# Patient Record
Sex: Male | Born: 1990
Health system: Southern US, Community
[De-identification: ages and names within clinical notes are randomized; demographics above are authoritative.]

---
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
## 2015-06-29 ENCOUNTER — Ambulatory Visit (HOSPITAL_COMMUNITY)
Admission: EM | Admit: 2015-06-29 | Discharge: 2015-06-29 | Disposition: A | Discharge status: Home / Self Care | Payer: Self-pay | Attending: Emergency Medicine | Admitting: Emergency Medicine

## 2015-06-29 ENCOUNTER — Encounter (HOSPITAL_COMMUNITY): Payer: Self-pay

## 2015-06-29 DIAGNOSIS — K08499 Partial loss of teeth due to other specified cause, unspecified class: Secondary | ICD-10-CM

## 2015-06-29 DIAGNOSIS — K08409 Partial loss of teeth, unspecified cause, unspecified class: Secondary | ICD-10-CM

## 2015-06-29 DIAGNOSIS — K122 Cellulitis and abscess of mouth: Secondary | ICD-10-CM

## 2015-06-29 MED ORDER — AMOXICILLIN-POT CLAVULANATE 875-125 MG PO TABS: 1.0000 | ORAL_TABLET | Freq: Two times a day (BID) | ORAL | Status: AC

## 2015-06-29 MED ORDER — CHLORHEXIDINE GLUCONATE 0.12 % MT SOLN: 15.0000 mL | Freq: Two times a day (BID) | OROMUCOSAL | Status: AC

## 2015-06-29 MED ORDER — TRAMADOL HCL 50 MG PO TABS: 50.0000 mg | ORAL_TABLET | Freq: Four times a day (QID) | ORAL | Status: AC | PRN

## 2015-06-29 NOTE — ED Provider Notes (Signed)
CSN: [id number]     Arrival date & time 06/29/15  1725 History   First MD Initiated Contact with Patient 06/29/15 1840     Chief Complaint  Patient presents with  . Oral Swelling   (Consider location/radiation/quality/duration/timing/severity/associated sxs/prior Treatment) HPI Comments: Patient is a 25 yo black male who presents 2 days following a wisdom tooth extraction with pain and swelling. He reports feeling like "food was lodged" and then started swelling. He has no fever or chills. In pain.   The history is provided by the patient.    History reviewed. No pertinent past medical history. History reviewed. No pertinent past surgical history. No family history on file. Social History  Substance Use Topics  . Smoking status: Never Smoker   . Smokeless tobacco: Never Used  . Alcohol Use: No    Review of Systems  Constitutional: Negative for fever.  HENT: Negative for drooling.   Neurological: Negative for dizziness.    Allergies  Motrin  Home Medications   Prior to Admission medications   Medication Sig Start Date End Date Taking? Authorizing Provider  amoxicillin-clavulanate (AUGMENTIN) 875-125 MG tablet Take 1 tablet by mouth 2 (two) times daily. 06/29/15   Riki SheerMichelle G Dresden Ament, PA-C  chlorhexidine (PERIDEX) 0.12 % solution Use as directed 15 mLs in the mouth or throat 2 (two) times daily. 06/29/15   Riki SheerMichelle G Somaya Grassi, PA-C  traMADol (ULTRAM) 50 MG tablet Take 1 tablet (50 mg total) by mouth every 6 (six) hours as needed. 06/29/15   Riki SheerMichelle G Jevaughn Degollado, PA-C   Meds Ordered and Administered this Visit  Medications - No data to display  BP 148/78 mmHg  Pulse 80  Temp(Src) 98.1 F (36.7 C) (Oral)  Resp 18  SpO2 100% No data found.   Physical Exam  Constitutional: He is oriented to person, place, and time. He appears well-developed and well-nourished. No distress.  HENT:  Mouth/Throat: Oropharynx is clear and moist. No oropharyngeal exudate.  Swelling to right side of  face, no cervical or tonsillar adenopathy, right lower wound is without debri, swelling is noted and tenderness on exam, no other abnormalities noted  Neck: Normal range of motion.  Lymphadenopathy:    He has no cervical adenopathy.  Neurological: He is alert and oriented to person, place, and time.  Skin: Skin is warm and dry. He is not diaphoretic.  Psychiatric: His behavior is normal.  Nursing note and vitals reviewed.   ED Course  Procedures (including critical care time)  Labs Review Labs Reviewed - No data to display  Imaging Review No results found.   Visual Acuity Review  Right Eye Distance:   Left Eye Distance:   Bilateral Distance:    Right Eye Near:   Left Eye Near:    Bilateral Near:         MDM   1. Oral infection   2. Wisdom teeth removed, unspecified edentulism    Expect some swelling 2 days post op. No abnormalities noted from my standpoint. Will cover with antibiotic and use Peridex as well. Highly recommend f/u with Oral surgeon on Monday. Should he develop worsening pain or swelling, or fevers that become emergent then certainly go to the ED for further management.      Riki SheerMichelle G Helma Argyle, PA-C 06/29/15 1921

## 2015-06-29 NOTE — ED Notes (Signed)
Patient states his right bottom wisdom tooth was extracted on Thursday 06/27/2015 and he ate some food last night and thinks he may have gotten food in the incision and may have developed and infection No acute distress

## 2015-06-29 NOTE — Discharge Instructions (Signed)
I am giving you an antibiotic for this, though there does not look like an infection at this point. The rinse will also help your pain and discomfort. Use the pain medication as needed.F/U with your surgeon on Monday!

## 2021-03-28 ENCOUNTER — Emergency Department (HOSPITAL_COMMUNITY): Payer: Self-pay

## 2021-03-28 ENCOUNTER — Emergency Department (HOSPITAL_COMMUNITY)
Admission: EM | Admit: 2021-03-28 | Discharge: 2021-03-28 | Disposition: A | Discharge status: Home / Self Care | Payer: Self-pay | Attending: Emergency Medicine | Admitting: Emergency Medicine

## 2021-03-28 ENCOUNTER — Encounter (HOSPITAL_COMMUNITY): Payer: Self-pay | Admitting: Emergency Medicine

## 2021-03-28 DIAGNOSIS — R55 Syncope and collapse: Secondary | ICD-10-CM | POA: Insufficient documentation

## 2021-03-28 LAB — BASIC METABOLIC PANEL
Anion gap: 7 (ref 5–15)
BUN: 18 mg/dL (ref 6–20)
CO2: 24 mmol/L (ref 22–32)
Calcium: 9.1 mg/dL (ref 8.9–10.3)
Chloride: 107 mmol/L (ref 98–111)
Creatinine, Ser: 1.2 mg/dL (ref 0.61–1.24)
GFR, Estimated: >60 mL/min (ref >60)
Glucose, Bld: 87 mg/dL (ref 70–99)
Potassium: 4.7 mmol/L (ref 3.5–5.1)
Sodium: 138 mmol/L (ref 135–145)

## 2021-03-28 LAB — URINALYSIS, ROUTINE W REFLEX MICROSCOPIC
Bilirubin Urine: NEGATIVE
Glucose, UA: NEGATIVE mg/dL
Hgb urine dipstick: NEGATIVE
Ketones, ur: NEGATIVE mg/dL
Leukocytes,Ua: NEGATIVE
Nitrite: NEGATIVE
Protein, ur: NEGATIVE mg/dL
Specific Gravity, Urine: 1.02 (ref 1.005–1.030)
pH: 6 (ref 5.0–8.0)

## 2021-03-28 LAB — CBC
HCT: 48.7 % (ref 39.0–52.0)
Hemoglobin: 15.8 g/dL (ref 13.0–17.0)
MCH: 29.9 pg (ref 26.0–34.0)
MCHC: 32.4 g/dL (ref 30.0–36.0)
MCV: 92.2 fL (ref 80.0–100.0)
Platelets: 189 10*3/uL (ref 150–400)
RBC: 5.28 MIL/uL (ref 4.22–5.81)
RDW: 14.5 % (ref 11.5–15.5)
WBC: 6.8 10*3/uL (ref 4.0–10.5)
nRBC: 0 % (ref 0.0–0.2)

## 2021-03-28 NOTE — ED Triage Notes (Signed)
Pt had 2 syncopal episodes at while working today.  Cbg 99.  Bp 99 sys dropped to 60 sys when standing.  Pt awake alert at this time.

## 2021-03-28 NOTE — ED Provider Notes (Signed)
[facility] EMERGENCY DEPARTMENT Provider Note   CSN: [id number] Arrival date & time: 03/28/21  1424     History  Chief Complaint  Patient presents with   Loss of Consciousness    Steven Pacheco is a 31 y.o. male.  HPI  Patient without significant medical history presents with complaints of syncopal episode.  Patient states today while he was at work he had 2 syncopal episodes.  He states the first 1 he was while he was sitting and when he got up he started get lightheaded and dizzy and felt flush he then had to sit back down.  Then when he got up again he actually syncopized and fell to the floor, he denies hitting his head, losing conscious, he is not on anticoag's.  He denies biting his tongue, becoming incontinent, has no seizure history, states he woke up and felt just fine, he states he has a slight headache, denies any change in vision paresthesias or weakness the upper lower extremities.  He states that this is happened in the past has been a long time, he states that he thinks he might of been dehydrated as he does not eat or drink very much, he has no other complaints this time.  He states prior to passing out he not having chest pain shortness of breath.become diaphoretic.   Home Medications Prior to Admission medications   Medication Sig Start Date End Date Taking? Authorizing Provider  amoxicillin-clavulanate (AUGMENTIN) 875-125 MG tablet Take 1 tablet by mouth 2 (two) times daily. 06/29/15   Riki Sheer, PA-C  chlorhexidine (PERIDEX) 0.12 % solution Use as directed 15 mLs in the mouth or throat 2 (two) times daily. 06/29/15   Riki Sheer, PA-C  traMADol (ULTRAM) 50 MG tablet Take 1 tablet (50 mg total) by mouth every 6 (six) hours as needed. 06/29/15   Riki Sheer, PA-C      Allergies    Coconut oil and Motrin [ibuprofen]    Review of Systems   Review of Systems  Constitutional:  Negative for chills and fever.  Respiratory:  Negative for shortness of  breath.   Cardiovascular:  Negative for chest pain.  Gastrointestinal:  Negative for abdominal pain.  Neurological:  Positive for syncope and headaches.   Physical Exam Updated Vital Signs BP 129/81    Pulse 92    Temp 98.1 F (36.7 C) (Oral)    Resp 14    Ht 5\' 11"  (1.803 m)    Wt 95.3 kg    SpO2 99%    BMI 29.29 kg/m  Physical Exam Vitals and nursing note reviewed.  Constitutional:      General: He is not in acute distress.    Appearance: He is not ill-appearing.  HENT:     Head: Normocephalic and atraumatic.     Nose: No congestion.     Mouth/Throat:     Mouth: Mucous membranes are moist.     Pharynx: Oropharynx is clear.  Eyes:     Extraocular Movements: Extraocular movements intact.     Conjunctiva/sclera: Conjunctivae normal.  Cardiovascular:     Rate and Rhythm: Normal rate and regular rhythm.     Pulses: Normal pulses.     Heart sounds: No murmur heard.   No friction rub. No gallop.  Pulmonary:     Effort: No respiratory distress.     Breath sounds: No wheezing, rhonchi or rales.  Abdominal:     Palpations: Abdomen is soft.  Tenderness: There is no abdominal tenderness. There is no right CVA tenderness or left CVA tenderness.  Musculoskeletal:     Cervical back: No rigidity.     Comments: Moving all 4 extremities no unilateral weakness  Spine was palpated nontender to palpation no step-off deformities noted.  Skin:    General: Skin is warm and dry.  Neurological:     Mental Status: He is alert.     GCS: GCS eye subscore is 4. GCS verbal subscore is 5. GCS motor subscore is 6.     Cranial Nerves: Cranial nerves 2-12 are intact.     Sensory: Sensation is intact.     Motor: Motor function is intact.     Coordination: Romberg sign negative.     Comments: Cranial nerves II through XII intact no difficult word finding, following two-step commands, no unilateral weakness present.  Psychiatric:        Mood and Affect: Mood normal.    ED Results / Procedures /  Treatments   Labs (all labs ordered are listed, but only abnormal results are displayed) Labs Reviewed  BASIC METABOLIC PANEL  CBC  URINALYSIS, ROUTINE W REFLEX MICROSCOPIC  GC/CHLAMYDIA PROBE AMP ([place]) NOT AT [facility]    EKG None  Radiology CT Head Wo Contrast  Result Date: 03/28/2021 CLINICAL DATA:  Headache, 2 syncopal episodes while at working today. Orthostasis EXAM: CT HEAD WITHOUT CONTRAST TECHNIQUE: Contiguous axial images were obtained from the base of the skull through the vertex without intravenous contrast. RADIATION DOSE REDUCTION: This exam was performed according to the departmental dose-optimization program which includes automated exposure control, adjustment of the mA and/or kV according to patient size and/or use of iterative reconstruction technique. COMPARISON:  01/16/2004 FINDINGS: Brain: Normal ventricular morphology. No midline shift or mass effect. Normal appearance of brain parenchyma. No intracranial hemorrhage, mass lesion, or evidence of acute infarction. No extra-axial fluid collections. Vascular: No hyperdense vessels Skull: Intact Sinuses/Orbits: Clear Other: N/A IMPRESSION: Normal exam. Electronically Signed   By: Ulyses Southward M.D.   On: 03/28/2021 17:05    Procedures Procedures    Medications Ordered in ED Medications - No data to display  ED Course/ Medical Decision Making/ A&P                           Medical Decision Making Amount and/or Complexity of Data Reviewed Labs: ordered. Radiology: ordered.   This patient presents to the ED for concern of syncopal episode, this involves an extensive number of treatment options, and is a complaint that carries with it a high risk of complications and morbidity.  The differential diagnosis includes CVA, ACS, seizures, metabolic derailments    Additional history obtained:  Additional history obtained from N/A    Co morbidities that complicate the patient evaluation  N/A  Social  Determinants of Health:  N/A    Lab Tests:  I Ordered, and personally interpreted labs.  The pertinent results include: CBC unremarkable, BMP unremarkable,   Imaging Studies ordered:  I ordered imaging studies including CT head I independently visualized and interpreted imaging which showed negative for acute findings I agree with the radiologist interpretation   EKG-without signs of ischemia or arrhythmias   Reevaluation:  Orthostatics were obtained here and they are unremarkable.  Patient endorsing a headache, no focal deficits, some.  Has had CT imaging in the past will obtain CT imaging for rule out of mass and/or intracranial head bleed  Rule out low suspicion for CVA or intracranial head bleed as patient denies change in vision, paresthesias or weakness to upper lower extremities, no neuro deficits noted on exam, CT head did not reveal any acute findings.  Low suspicion for ACS or arrhythmias as patient denies chest pain, shortness of breath, no hypoperfusion or fluid overload on exam, EKG sinus without signs of ischemia.  I have low suspicion for seizure as presentation atypical, no postictal state, no tongue biting, no urinary incontinency.  Low suspicion for systemic infection as patient is nontoxic-appearing, vital signs reassuring, no obvious source infection noted on exam.       Dispostion and problem list  After consideration of the diagnostic results and the patients response to treatment, I feel that the patent would benefit from discharge.   Syncope-likely vasovagal this patient set up became dizzy and fainted.  Will recommend that he stays hydrated, follow-up with his PCP for further evaluation.  Gave strict return precautions.            Final Clinical Impression(s) / ED Diagnoses Final diagnoses:  Syncope and collapse    Rx / DC Orders ED Discharge Orders     None         Carroll Sage, PA-C 03/28/21 Luiz Iron    Bethann Berkshire,  MD 03/30/21 1108

## 2021-03-28 NOTE — Discharge Instructions (Signed)
Lab work imaging reassuring, please remember to stay hydrated, and eat plenty of small snacks is to help decrease your chances of passing out.  If you start to feel like you are going to pass out please sit down and control your breathing until you feel better.  I want you to follow-up with your PCP for further evaluation.  Come back to the emergency department if you develop chest pain, shortness of breath, severe abdominal pain, uncontrolled nausea, vomiting, diarrhea.

## 2021-12-22 ENCOUNTER — Ambulatory Visit: Payer: 59 | Admitting: Family Medicine

## 2022-01-26 DIAGNOSIS — F1220 Cannabis dependence, uncomplicated: Secondary | ICD-10-CM | POA: Diagnosis not present

## 2022-07-28 DIAGNOSIS — Z2989 Encounter for other specified prophylactic measures: Secondary | ICD-10-CM | POA: Diagnosis not present

## 2022-07-30 DIAGNOSIS — Z00110 Health examination for newborn under 8 days old: Secondary | ICD-10-CM | POA: Diagnosis not present

## 2022-08-05 DIAGNOSIS — Z00111 Health examination for newborn 8 to 28 days old: Secondary | ICD-10-CM | POA: Diagnosis not present

## 2023-04-06 IMAGING — CT CT HEAD W/O CM
4 series · 16 of 47 positions shown, 18 images · non-contrast
Comparison: 01/16/2004

CLINICAL DATA: Headache, 2 syncopal episodes while at working
today. Orthostasis



[Series 2: head w o · axial · 0.43mm/px · z∈[+81,+196]mm · 7 of 31 slices shown, 9 images]
[im 4/31  brain]
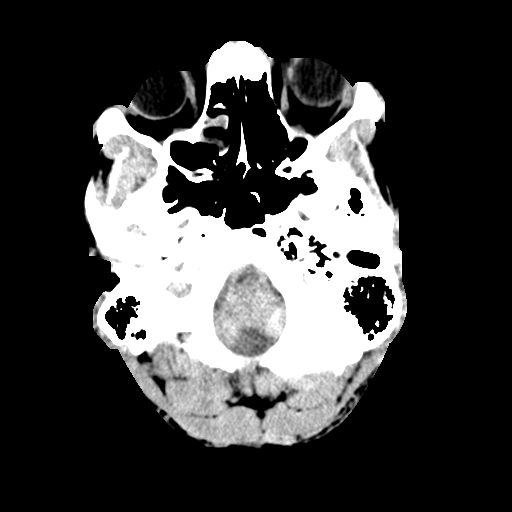
[im 4/31  bone]
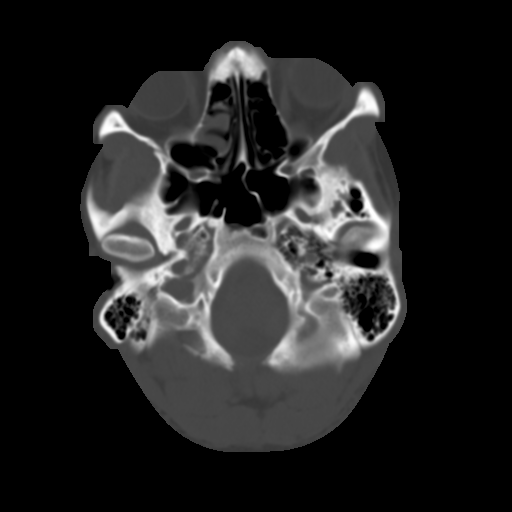
[im 8/31  brain]
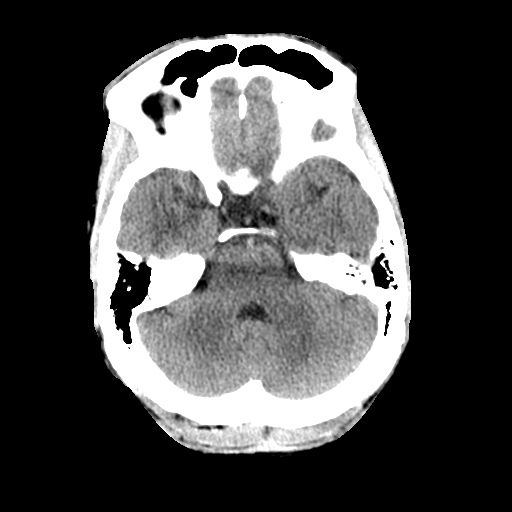
[im 12/31  brain]
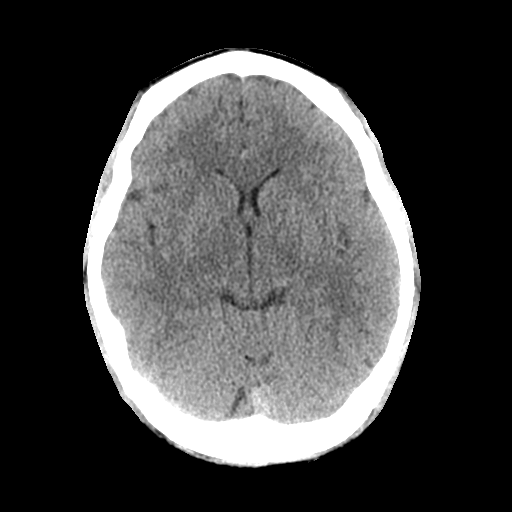
[im 16/31  brain]
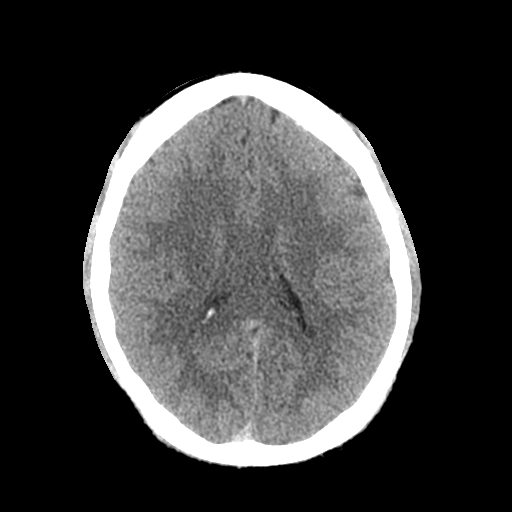
[im 19/31  brain]
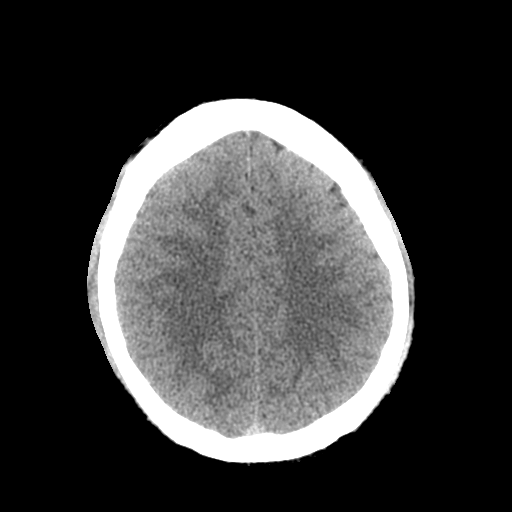
[im 19/31  bone]
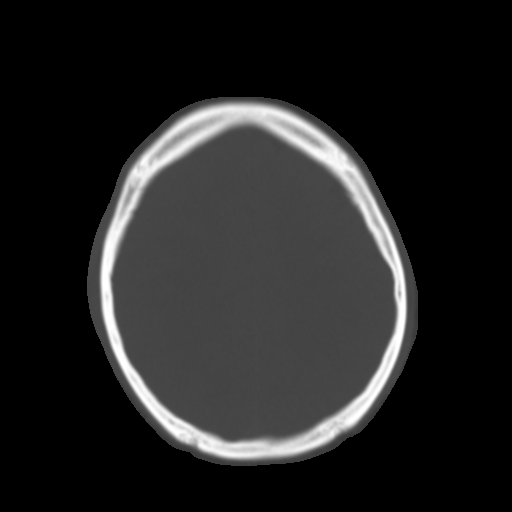
[im 23/31  brain]
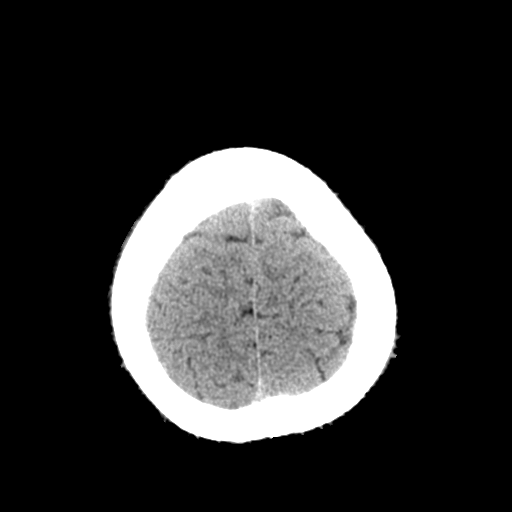
[im 27/31  brain]
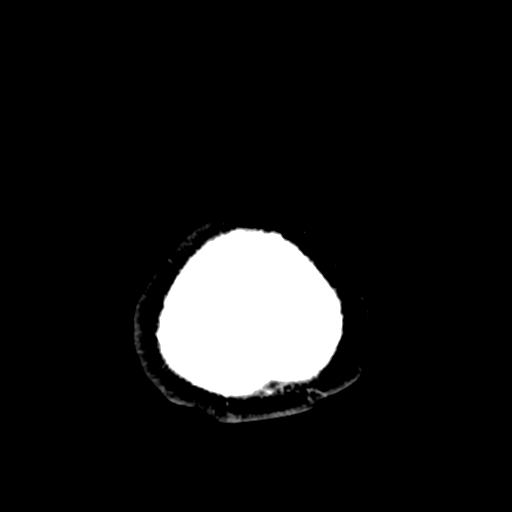

[Series 3: head bone · axial · 0.43mm/px · z∈[+80,+110]mm · 3 of 77 slices shown]
[im 8/77  bone]
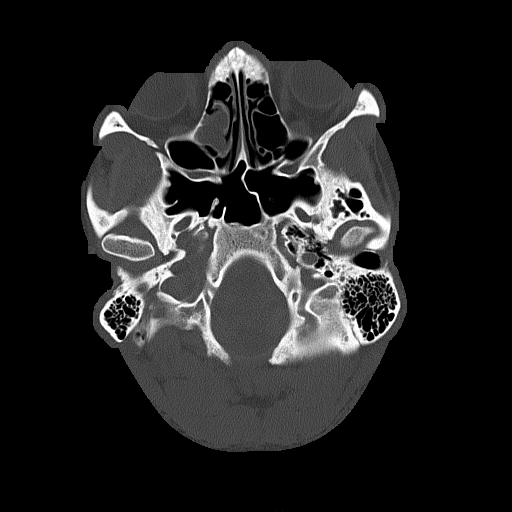
[im 16/77  bone]
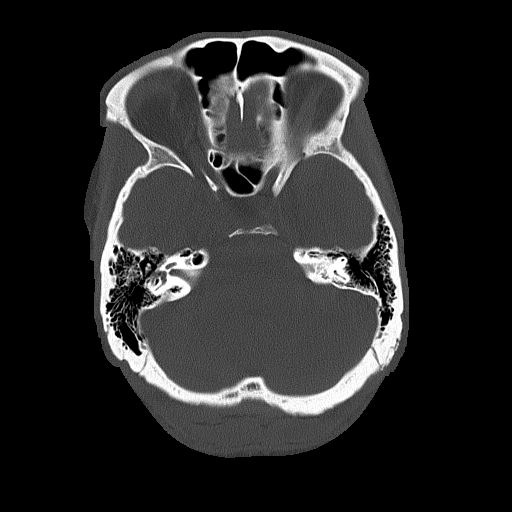
[im 23/77  bone]
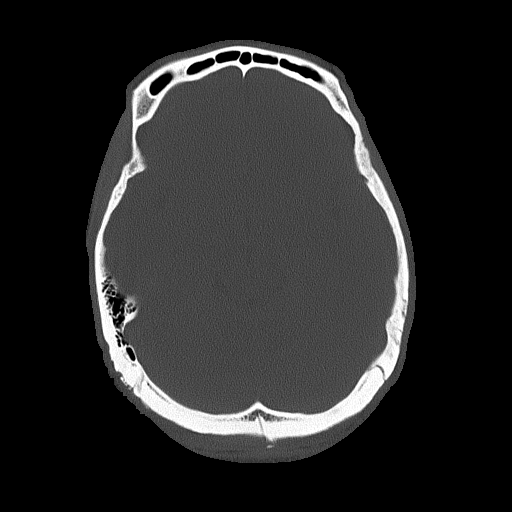

[Series 4: coronal soft · coronal · 0.31mm/px · 3 of 68 slices shown]
[im 23/68  brain]
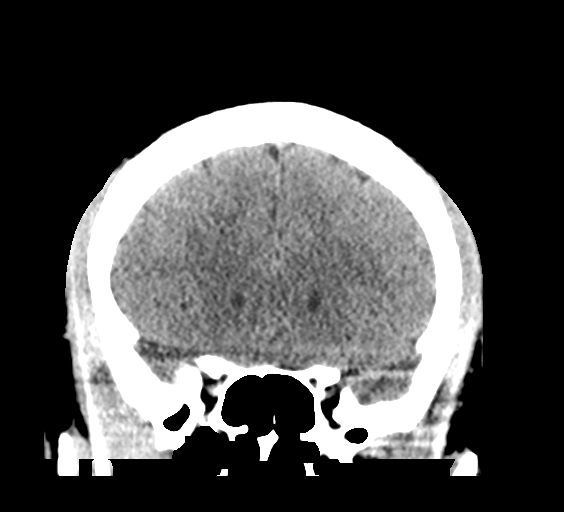
[im 30/68  brain]
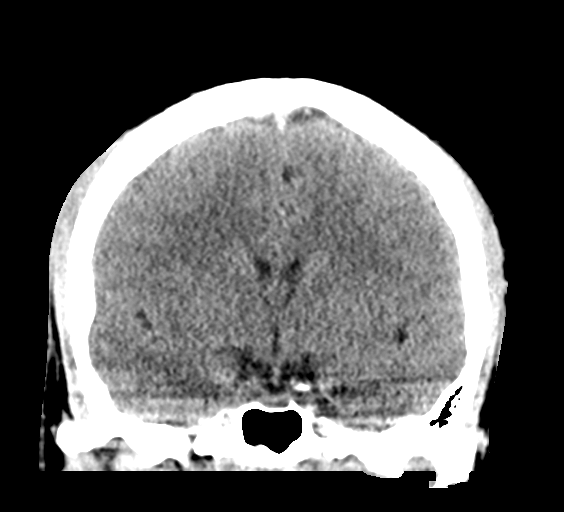
[im 38/68  brain]
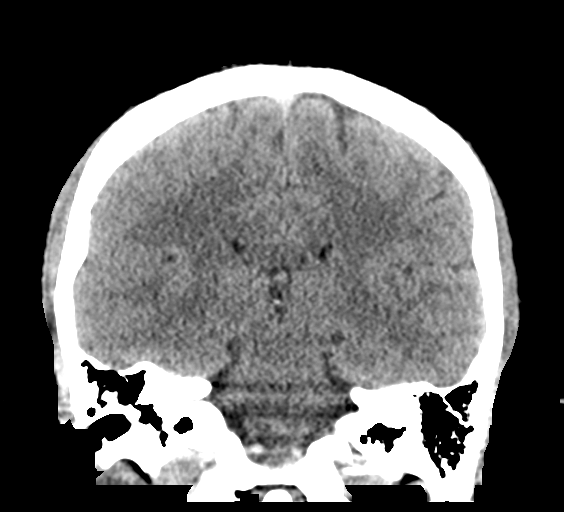

[Series 5: sagittal soft · sagittal · 0.31mm/px · 3 of 59 slices shown]
[im 20/59  brain]
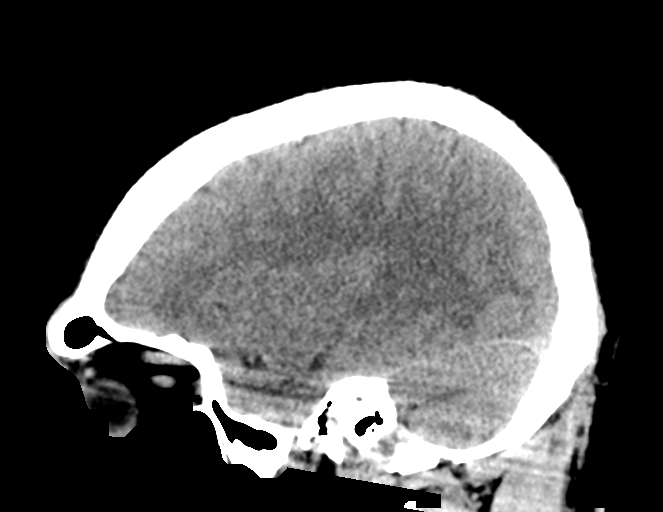
[im 30/59  brain]
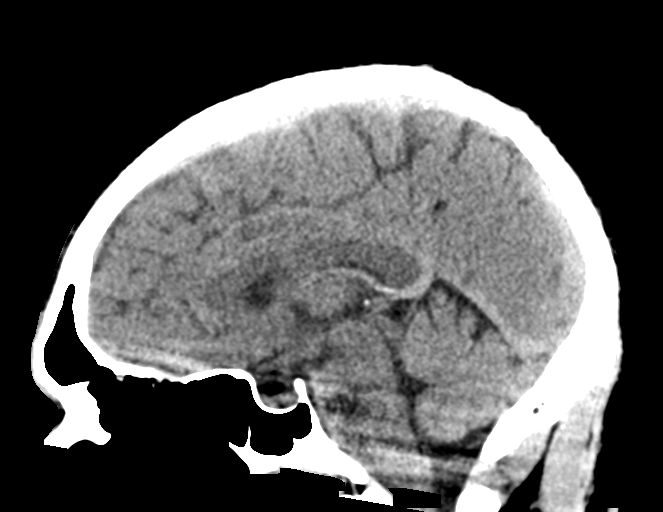
[im 39/59  brain]
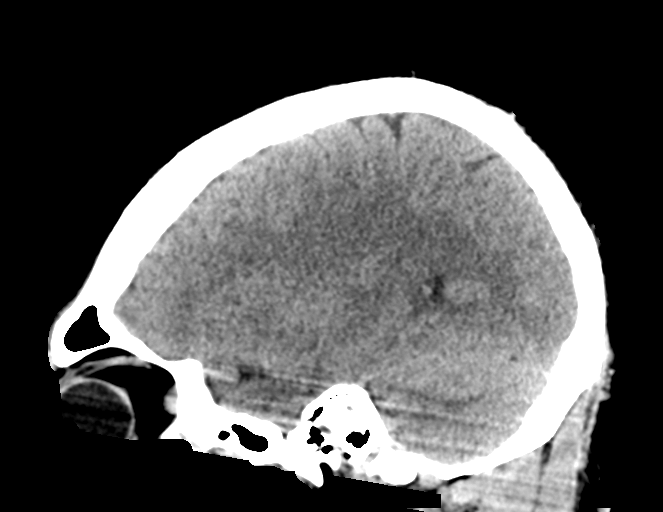

[16 of 47 positions shown; findings below may reference images not displayed]

FINDINGS: Brain: Normal ventricular morphology. No midline shift or mass
effect. Normal appearance of brain parenchyma. No intracranial
hemorrhage, mass lesion, or evidence of acute infarction. No
extra-axial fluid collections.

Vascular: No hyperdense vessels

Skull: Intact

Sinuses/Orbits: Clear

Other: N/A
IMPRESSION: Normal exam.
